# Patient Record
Sex: Female | Born: 1990 | Race: White | Hispanic: No | Marital: Single | Smoking: Current every day smoker
Health system: Southern US, Community
[De-identification: ages and names within clinical notes are randomized; demographics above are authoritative.]

## PROBLEM LIST (undated history)

## (undated) DIAGNOSIS — E282 Polycystic ovarian syndrome: Secondary | ICD-10-CM

---

## 2017-04-14 ENCOUNTER — Emergency Department (HOSPITAL_COMMUNITY)
Admission: EM | Admit: 2017-04-14 | Discharge: 2017-04-14 | Disposition: A | Discharge status: Home / Self Care | Payer: 59 | Attending: Emergency Medicine | Admitting: Emergency Medicine

## 2017-04-14 ENCOUNTER — Encounter (HOSPITAL_COMMUNITY): Payer: Self-pay | Admitting: *Deleted

## 2017-04-14 ENCOUNTER — Emergency Department (HOSPITAL_COMMUNITY): Payer: 59

## 2017-04-14 DIAGNOSIS — O26891 Other specified pregnancy related conditions, first trimester: Secondary | ICD-10-CM | POA: Diagnosis present

## 2017-04-14 DIAGNOSIS — O99331 Smoking (tobacco) complicating pregnancy, first trimester: Secondary | ICD-10-CM | POA: Insufficient documentation

## 2017-04-14 DIAGNOSIS — Z3A01 Less than 8 weeks gestation of pregnancy: Secondary | ICD-10-CM

## 2017-04-14 DIAGNOSIS — R109 Unspecified abdominal pain: Secondary | ICD-10-CM

## 2017-04-14 DIAGNOSIS — Z3A08 8 weeks gestation of pregnancy: Secondary | ICD-10-CM | POA: Insufficient documentation

## 2017-04-14 DIAGNOSIS — O0281 Inappropriate change in quantitative human chorionic gonadotropin (hCG) in early pregnancy: Secondary | ICD-10-CM | POA: Insufficient documentation

## 2017-04-14 DIAGNOSIS — F17210 Nicotine dependence, cigarettes, uncomplicated: Secondary | ICD-10-CM | POA: Insufficient documentation

## 2017-04-14 DIAGNOSIS — R110 Nausea: Secondary | ICD-10-CM

## 2017-04-14 HISTORY — DX: Polycystic ovarian syndrome: E28.2

## 2017-04-14 LAB — COMPREHENSIVE METABOLIC PANEL
ALBUMIN: 4.1 g/dL (ref 3.5–5.0)
ALT: 10 U/L — ABNORMAL LOW (ref 14–54)
AST: 13 U/L — AB (ref 15–41)
Alkaline Phosphatase: 33 U/L — ABNORMAL LOW (ref 38–126)
Anion gap: 8 (ref 5–15)
BUN: 8 mg/dL (ref 6–20)
CHLORIDE: 108 mmol/L (ref 101–111)
CO2: 21 mmol/L — AB (ref 22–32)
Calcium: 8.8 mg/dL — ABNORMAL LOW (ref 8.9–10.3)
Creatinine, Ser: 0.5 mg/dL (ref 0.44–1.00)
GFR calc Af Amer: >60 mL/min (ref >60)
GLUCOSE: 102 mg/dL — AB (ref 65–99)
POTASSIUM: 3.6 mmol/L (ref 3.5–5.1)
SODIUM: 137 mmol/L (ref 135–145)
Total Bilirubin: 0.4 mg/dL (ref 0.3–1.2)
Total Protein: 7 g/dL (ref 6.5–8.1)

## 2017-04-14 LAB — URINALYSIS, ROUTINE W REFLEX MICROSCOPIC
Bilirubin Urine: NEGATIVE
GLUCOSE, UA: NEGATIVE mg/dL
Hgb urine dipstick: NEGATIVE
KETONES UR: NEGATIVE mg/dL
LEUKOCYTES UA: NEGATIVE
Nitrite: NEGATIVE
PH: 6 (ref 5.0–8.0)
Protein, ur: NEGATIVE mg/dL
Specific Gravity, Urine: 1.024 (ref 1.005–1.030)

## 2017-04-14 LAB — CBC
HEMATOCRIT: 36.5 % (ref 36.0–46.0)
Hemoglobin: 12.3 g/dL (ref 12.0–15.0)
MCH: 30.4 pg (ref 26.0–34.0)
MCHC: 33.7 g/dL (ref 30.0–36.0)
MCV: 90.3 fL (ref 78.0–100.0)
Platelets: 314 10*3/uL (ref 150–400)
RBC: 4.04 MIL/uL (ref 3.87–5.11)
RDW: 12.1 % (ref 11.5–15.5)
WBC: 9.6 10*3/uL (ref 4.0–10.5)

## 2017-04-14 LAB — WET PREP, GENITAL
CLUE CELLS WET PREP: NONE SEEN
SPERM: NONE SEEN
TRICH WET PREP: NONE SEEN
Yeast Wet Prep HPF POC: NONE SEEN

## 2017-04-14 LAB — HCG, QUANTITATIVE, PREGNANCY: hCG, Beta Chain, Quant, S: 40418 m[IU]/mL — ABNORMAL HIGH (ref <5)

## 2017-04-14 MED ORDER — ONDANSETRON 4 MG PO TBDP
4.0000 mg | ORAL_TABLET | Freq: Once | ORAL | Status: AC
  Administered 2017-04-14: 4 mg via ORAL
  Filled 2017-04-14: qty 1

## 2017-04-14 MED ORDER — ONDANSETRON 4 MG PO TBDP: 4.0000 mg | ORAL_TABLET | Freq: Three times a day (TID) | ORAL | 0 refills | Status: AC | PRN

## 2017-04-14 NOTE — Discharge Instructions (Signed)
Take zofran for nausea Take Tylenol for pain Follow up with OBGYN

## 2017-04-14 NOTE — ED Provider Notes (Signed)
[facility]-EMERGENCY DEPT Provider Note   CSN: [id number] Arrival date & time: 04/14/17  1649     History   Chief Complaint No chief complaint on file.   HPI Bethany Walters is a 26 y.o. female who presents with left lower abdominal cramping. Past medical history significant for a PCOS. She states that she often has abdominal cramping and irregular periods however after she missed her period and cramping was worse than normal. She took a pregnancy test this morning which is positive. She is it is sharp and intermittent on the left side and sometimes dull and achy. The pain is constant but "comes in waves". She has never been pregnant before. She denies any abnormal Pap smears or prior gynecologic procedures. She reports associated vaginal spotting and nausea. No fever, chills, chest pain, shortness of breath, upper abdominal pain, vomiting, constipation or diarrhea, dysuria, vaginal discharge. LMP was 3/16.   HPI  Past Medical History:  Diagnosis Date  . PCOS (polycystic ovarian syndrome)     There are no active problems to display for this patient.   No past surgical history on file.  OB History    Gravida Para Term Preterm AB Living   1             SAB TAB Ectopic Multiple Live Births                   Home Medications    Prior to Admission medications   Not on File    Family History No family history on file.  Social History Social History  Substance Use Topics  . Smoking status: Current Every Day Smoker    Types: E-cigarettes  . Smokeless tobacco: Never Used  . Alcohol use No     Allergies   Patient has no known allergies.   Review of Systems Review of Systems  Constitutional: Negative for chills and fever.  Respiratory: Negative for shortness of breath.   Cardiovascular: Negative for chest pain.  Gastrointestinal: Positive for abdominal pain and nausea. Negative for constipation, diarrhea and vomiting.  Genitourinary: Positive for pelvic pain and vaginal  bleeding. Negative for dysuria and vaginal discharge.  Musculoskeletal: Positive for back pain.  All other systems reviewed and are negative.    Physical Exam Updated Vital Signs BP 136/79 (BP Location: Left Arm)   Pulse 79   Temp 98.1 F (36.7 C) (Oral)   Resp 18   Ht 5\' 8"  (1.727 m)   Wt 83.9 kg   LMP 02/21/2017   SpO2 100%   BMI 28.13 kg/m   Physical Exam  Constitutional: She is oriented to person, place, and time. She appears well-developed and well-nourished. No distress.  Calm, cooperative, no acute distress  HENT:  Head: Normocephalic and atraumatic.  Eyes: Conjunctivae are normal. Pupils are equal, round, and reactive to light. Right eye exhibits no discharge. Left eye exhibits no discharge. No scleral icterus.  Neck: Normal range of motion.  Cardiovascular: Normal rate and regular rhythm.  Exam reveals no gallop and no friction rub.   No murmur heard. Pulmonary/Chest: Effort normal and breath sounds normal. No respiratory distress. She has no wheezes. She has no rales. She exhibits no tenderness.  Abdominal: Soft. Bowel sounds are normal. She exhibits no distension and no mass. There is tenderness (Left lower worse than right). There is no rebound and no guarding. No hernia.  Genitourinary:  Genitourinary Comments: Pelvic: No inguinal lymphadenopathy or inguinal hernia noted. Normal external genitalia. No pain with speculum  insertion. Closed cervical os with normal appearance - no rash or lesions. No significant discharge or bleeding noted from cervix or in vaginal vault. On bimanual examination there is mild left adnexal tenderness without cervical motion tenderness. Chaperone present during exam.   Musculoskeletal:  No low back tenderness  Neurological: She is alert and oriented to person, place, and time.  Skin: Skin is warm and dry.  Psychiatric: She has a normal mood and affect. Her behavior is normal.  Nursing note and vitals reviewed.    ED Treatments /  Results  Labs (all labs ordered are listed, but only abnormal results are displayed) Labs Reviewed  WET PREP, GENITAL - Abnormal; Notable for the following:       Result Value   WBC, Wet Prep HPF POC MODERATE (*)    All other components within normal limits  COMPREHENSIVE METABOLIC PANEL - Abnormal; Notable for the following:    CO2 21 (*)    Glucose, Bld 102 (*)    Calcium 8.8 (*)    AST 13 (*)    ALT 10 (*)    Alkaline Phosphatase 33 (*)    All other components within normal limits  URINALYSIS, ROUTINE W REFLEX MICROSCOPIC - Abnormal; Notable for the following:    APPearance HAZY (*)    All other components within normal limits  HCG, QUANTITATIVE, PREGNANCY - Abnormal; Notable for the following:    hCG, Beta Chain, Quant, S 40,418 (*)    All other components within normal limits  CBC  RPR  HIV ANTIBODY (ROUTINE TESTING)  GC/CHLAMYDIA PROBE AMP ([place]) NOT AT [facility]    EKG  EKG Interpretation None       Radiology US Ob Comp < 14 Wks  Result Date: 04/14/2017 CLINICAL DATA:  26 y/o F; left-sided back pain the spotting for 2 days. EXAM: OBSTETRIC <14 WK [place] AND TRANSVAGINAL OB US TECHNIQUE: Both transabdominal and transvaginal ultrasound examinations were performed for complete evaluation of the gestation as well as the maternal uterus, adnexal regions, and pelvic cul-de-sac. Transvaginal technique was performed to assess early pregnancy. COMPARISON:  None. FINDINGS: Intrauterine gestational sac: Single Yolk sac:  Visualized. Embryo:  Visualized. Cardiac Activity: Visualized. Heart Rate: 112  bpm CRL:  6.1  mm   6 w   3 d                  [place] EDC: 12/05/2017 Subchorionic hemorrhage:  None visualized. Maternal uterus/adnexae: Left-sided corpus luteum cyst. IMPRESSION: Single live intrauterine pregnancy with estimated gestational age of [redacted] weeks and 3 days. Electronically Signed   By: Mitzi Hansen M.D.   On: 04/14/2017 22:09   US Ob Transvaginal  Result Date:  04/14/2017 CLINICAL DATA:  26 y/o F; left-sided back pain the spotting for 2 days. EXAM: OBSTETRIC <14 WK [place] AND TRANSVAGINAL OB US TECHNIQUE: Both transabdominal and transvaginal ultrasound examinations were performed for complete evaluation of the gestation as well as the maternal uterus, adnexal regions, and pelvic cul-de-sac. Transvaginal technique was performed to assess early pregnancy. COMPARISON:  None. FINDINGS: Intrauterine gestational sac: Single Yolk sac:  Visualized. Embryo:  Visualized. Cardiac Activity: Visualized. Heart Rate: 112  bpm CRL:  6.1  mm   6 w   3 d                  [place] EDC: 12/05/2017 Subchorionic hemorrhage:  None visualized. Maternal uterus/adnexae: Left-sided corpus luteum cyst. IMPRESSION: Single live intrauterine pregnancy with estimated gestational age of [redacted]  weeks and 3 days. Electronically Signed   By: Mitzi Hansen M.D.   On: 04/14/2017 22:09    Procedures Procedures (including critical care time)  Medications Ordered in ED Medications  ondansetron (ZOFRAN-ODT) disintegrating tablet 4 mg (4 mg Oral Given 04/14/17 2110)     Initial Impression / Assessment and Plan / ED Course  I have reviewed the triage vital signs and the nursing notes.  Pertinent labs & imaging results that were available during my care of the patient were reviewed by me and considered in my medical decision making (see chart for details).  26 year old female with positive pregnancy test and left-sided abdominal cramping. She was mildly hypertensive and tachycardic on arrival but on recheck vital signs are normal. CBC unremarkable. CMP overall unremarkable. UA is negative. Formal hCG is 40,000 consistent with history. Pelvic exam is unremarkable. Will obtain ultrasound to rule out ectopic.  Ultrasound remarkable for IUP at 6 weeks and 3 days. Patient reports her nausea is significantly improved after Zofran. We'll DC with prescription for Zofran and follow up with OB/GYN. Return  precautions given.  Final Clinical Impressions(s) / ED Diagnoses   Final diagnoses:  Less than [redacted] weeks gestation of pregnancy  Abdominal cramping  Nausea    New Prescriptions New Prescriptions   No medications on file     Beryle Quant 04/14/17 2329    Arby Barrette, MD 04/18/17 [id number]

## 2017-04-14 NOTE — ED Notes (Signed)
Patient transported to Ultrasound 

## 2017-04-14 NOTE — ED Triage Notes (Signed)
Pt states positive pregnancy test, lmp 02/21/17.  States severe L sided and L back cramping since last night.  States some spotting, but not large clots.

## 2017-04-15 LAB — RPR: RPR: NONREACTIVE

## 2017-04-15 LAB — HIV ANTIBODY (ROUTINE TESTING W REFLEX): HIV Screen 4th Generation wRfx: NONREACTIVE

## 2017-04-15 LAB — GC/CHLAMYDIA PROBE AMP (~~LOC~~) NOT AT ARMC
Chlamydia: NEGATIVE
NEISSERIA GONORRHEA: NEGATIVE

## 2018-08-29 ENCOUNTER — Emergency Department (HOSPITAL_COMMUNITY): Payer: 59

## 2018-08-29 ENCOUNTER — Encounter (HOSPITAL_COMMUNITY): Payer: Self-pay | Admitting: Emergency Medicine

## 2018-08-29 ENCOUNTER — Emergency Department (HOSPITAL_COMMUNITY)
Admission: EM | Admit: 2018-08-29 | Discharge: 2018-08-30 | Disposition: A | Discharge status: Home / Self Care | Payer: 59 | Attending: Emergency Medicine | Admitting: Emergency Medicine

## 2018-08-29 DIAGNOSIS — R5383 Other fatigue: Secondary | ICD-10-CM | POA: Insufficient documentation

## 2018-08-29 DIAGNOSIS — R0602 Shortness of breath: Secondary | ICD-10-CM | POA: Diagnosis not present

## 2018-08-29 DIAGNOSIS — R110 Nausea: Secondary | ICD-10-CM | POA: Insufficient documentation

## 2018-08-29 DIAGNOSIS — F17290 Nicotine dependence, other tobacco product, uncomplicated: Secondary | ICD-10-CM | POA: Diagnosis not present

## 2018-08-29 DIAGNOSIS — R42 Dizziness and giddiness: Secondary | ICD-10-CM | POA: Diagnosis not present

## 2018-08-29 LAB — BASIC METABOLIC PANEL
Anion gap: 11 (ref 5–15)
BUN: 10 mg/dL (ref 6–20)
CHLORIDE: 106 mmol/L (ref 98–111)
CO2: 23 mmol/L (ref 22–32)
CREATININE: 0.65 mg/dL (ref 0.44–1.00)
Calcium: 9.4 mg/dL (ref 8.9–10.3)
GFR calc Af Amer: >60 mL/min (ref >60)
GFR calc non Af Amer: >60 mL/min (ref >60)
Glucose, Bld: 110 mg/dL — ABNORMAL HIGH (ref 70–99)
Potassium: 4.2 mmol/L (ref 3.5–5.1)
SODIUM: 140 mmol/L (ref 135–145)

## 2018-08-29 LAB — CBC
HCT: 40 % (ref 36.0–46.0)
Hemoglobin: 13 g/dL (ref 12.0–15.0)
MCH: 30.4 pg (ref 26.0–34.0)
MCHC: 32.5 g/dL (ref 30.0–36.0)
MCV: 93.7 fL (ref 78.0–100.0)
PLATELETS: 351 10*3/uL (ref 150–400)
RBC: 4.27 MIL/uL (ref 3.87–5.11)
RDW: 11.9 % (ref 11.5–15.5)
WBC: 7.6 10*3/uL (ref 4.0–10.5)

## 2018-08-29 LAB — URINALYSIS, ROUTINE W REFLEX MICROSCOPIC
Bilirubin Urine: NEGATIVE
Glucose, UA: NEGATIVE mg/dL
HGB URINE DIPSTICK: NEGATIVE
Ketones, ur: NEGATIVE mg/dL
Leukocytes, UA: NEGATIVE
NITRITE: NEGATIVE
Protein, ur: NEGATIVE mg/dL
SPECIFIC GRAVITY, URINE: 1.008 (ref 1.005–1.030)
pH: 6 (ref 5.0–8.0)

## 2018-08-29 LAB — I-STAT BETA HCG BLOOD, ED (MC, WL, AP ONLY)

## 2018-08-29 LAB — I-STAT TROPONIN, ED: Troponin i, poc: 0 ng/mL (ref 0.00–0.08)

## 2018-08-29 MED ORDER — SODIUM CHLORIDE 0.9 % IV BOLUS
1000.0000 mL | Freq: Once | INTRAVENOUS | Status: AC
  Administered 2018-08-29: 1000 mL via INTRAVENOUS

## 2018-08-29 NOTE — ED Notes (Signed)
Will come back to room, when radiology returns pt

## 2018-08-29 NOTE — ED Triage Notes (Signed)
Pt presents with feelings of dizziness, lightheadedness, and nausea and also anxiety and panic attacks; pt was seen at [facility] and was told she has a vitamin B12 deficiency and told to eat more B12 containing foods; pt denies pain currently; pt states she has been under a lot more stress lately and changed her diet

## 2018-08-29 NOTE — ED Notes (Signed)
Patient transported to X-ray 

## 2018-08-30 NOTE — Discharge Instructions (Addendum)
1. Medications: usual home medications 2. Treatment: rest, drink plenty of fluids, Take Vitamin B12 orally 1x per day.  Additionally, please take calcium 500mg  per day and Vitamin D3 800 units daily - discuss these doses with your PCP   3. Follow Up: Please followup with your primary doctor at your upcoming appointment for discussion of your diagnoses and further evaluation after today's visit; please use the resource guide to find a psychiatrist; Please return to the ER for new or worsening symptoms including total syncope or other concerns.

## 2018-08-30 NOTE — ED Provider Notes (Signed)
[facility]   CSN: [id number] Arrival date & time: 08/29/18  2212     History   Chief Complaint Chief Complaint  Patient presents with  . Dizziness  . Near Syncope  . Nausea  . Abdominal Pain  . Tingling  . Panic Attack    HPI Bethany Walters is a 27 y.o. female with a hx of PCOS presents to the Emergency Department complaining of gradual, intermittent, progressively worsening fatigue and lightheadedness onset approx 6 mos ago but worsening in the last week.  Pt reports in the last 2 days she has had several episodes of panicking when she began to feel dizzy.  She reports these led to a feeling of SOB and worsening dizziness.  She reports symptoms of SOB and dizziness abate when her panicking subsides.  Pt reports no formal diagnosis of anxiety or panic attacks, but she does believe this is what they are.  She reports she has been vegetarian for several years ago and switched to a vegan diet about the time these symptoms started.  She reports she has had associated nausea today after eating chicken for the first time in many years.  No vomiting or diarrhea. Pt reports she is scheduled with a new PCP in 8 days, but wanted to get checked out tonight after her dizzy episode.  She reports increased stress in her life in the last few months.  She denies SI/HI/AVH.    The history is provided by the patient and medical records. No language interpreter was used.    Past Medical History:  Diagnosis Date  . PCOS (polycystic ovarian syndrome)     There are no active problems to display for this patient.   History reviewed. No pertinent surgical history.   OB History    Gravida  1   Para      Term      Preterm      AB      Living        SAB      TAB      Ectopic      Multiple      Live Births               Home Medications    Prior to Admission medications   Medication Sig Start Date End Date Taking?  Authorizing Provider  ondansetron (ZOFRAN ODT) 4 MG disintegrating tablet Take 1 tablet (4 mg total) by mouth every 8 (eight) hours as needed for nausea or vomiting. Patient not taking: Reported on 08/29/2018 04/14/17   Bethel BornGekas, Kelly Marie, PA-C    Family History History reviewed. No pertinent family history.  Social History Social History   Tobacco Use  . Smoking status: Current Every Day Smoker    Types: E-cigarettes  . Smokeless tobacco: Never Used  . Tobacco comment: vape  Substance Use Topics  . Alcohol use: No  . Drug use: No     Allergies   Patient has no known allergies.   Review of Systems Review of Systems  Constitutional: Negative for appetite change, diaphoresis, fatigue, fever and unexpected weight change.  HENT: Negative for mouth sores.   Eyes: Negative for visual disturbance.  Respiratory: Negative for cough, chest tightness, shortness of breath and wheezing.   Cardiovascular: Positive for near-syncope. Negative for chest pain.  Gastrointestinal: Positive for nausea. Negative for abdominal pain, constipation, diarrhea and vomiting.  Endocrine: Negative for polydipsia, polyphagia and polyuria.  Genitourinary: Negative for  dysuria, frequency, hematuria and urgency.  Musculoskeletal: Negative for back pain and neck stiffness.  Skin: Negative for rash.  Allergic/Immunologic: Negative for immunocompromised state.  Neurological: Positive for dizziness and light-headedness. Negative for syncope and headaches.  Hematological: Does not bruise/bleed easily.  Psychiatric/Behavioral: Negative for sleep disturbance. The patient is not nervous/anxious.      Physical Exam Updated Vital Signs BP (!) 139/103   Pulse 66   Temp 98 F (36.7 C) (Oral)   Resp 20   Ht 5\' 8"  (1.727 m)   Wt 100.7 kg   LMP 08/13/2018   SpO2 99%   Breastfeeding? No   BMI 33.75 kg/m   Physical Exam  Constitutional: She appears well-developed and well-nourished. No distress.  Awake,  alert, nontoxic appearance  HENT:  Head: Normocephalic and atraumatic.  Mouth/Throat: Oropharynx is clear and moist. No oropharyngeal exudate.  Eyes: Conjunctivae are normal. No scleral icterus.  Neck: Normal range of motion. Neck supple.  Cardiovascular: Normal rate, regular rhythm and intact distal pulses.  Pulmonary/Chest: Effort normal and breath sounds normal. No respiratory distress. She has no wheezes.  Equal chest expansion  Abdominal: Soft. Bowel sounds are normal. She exhibits no mass. There is no tenderness. There is no rebound and no guarding.  Musculoskeletal: Normal range of motion. She exhibits no edema.  Neurological: She is alert.  Speech is clear and goal oriented Moves extremities without ataxia  Skin: Skin is warm and dry. She is not diaphoretic.  Psychiatric: Her speech is normal and behavior is normal. Judgment and thought content normal. Her mood appears anxious. She is not actively hallucinating. Cognition and memory are normal. She expresses no homicidal and no suicidal ideation.  Nursing note and vitals reviewed.    ED Treatments / Results  Labs (all labs ordered are listed, but only abnormal results are displayed) Labs Reviewed  BASIC METABOLIC PANEL - Abnormal; Notable for the following components:      Result Value   Glucose, Bld 110 (*)    All other components within normal limits  URINALYSIS, ROUTINE W REFLEX MICROSCOPIC - Abnormal; Notable for the following components:   Color, Urine STRAW (*)    All other components within normal limits  CBC  I-STAT TROPONIN, ED  I-STAT BETA HCG BLOOD, ED ([facility], [facility], [facility] ONLY)    EKG None  Radiology Dg Chest 2 View  Result Date: 08/29/2018 CLINICAL DATA:  Dizziness and lightheadedness with nausea and anxiety. EXAM: CHEST - 2 VIEW COMPARISON:  None. FINDINGS: The heart size and mediastinal contours are within normal limits. Both lungs are clear. S-shaped scoliosis of the thoracolumbar spine. No acute osseous  abnormality. IMPRESSION: No active cardiopulmonary disease. Electronically Signed   By: Tollie Eth M.D.   On: 08/29/2018 23:12    Procedures Procedures (including critical care time)  Medications Ordered in ED Medications  sodium chloride 0.9 % bolus 1,000 mL (1,000 mLs Intravenous New Bag/Given 08/29/18 2350)     Initial Impression / Assessment and Plan / ED Course  I have reviewed the triage vital signs and the nursing notes.  Pertinent labs & imaging results that were available during my care of the patient were reviewed by me and considered in my medical decision making (see chart for details).  Clinical Course as of Aug 30 126  Wynelle Link Aug 30, 2018  0118 Mild hypertension noted.  Suspect this is secondary to anxiety  BP(!): 139/103 [HM]  0118 No evidence of urinary tract infection  Protein: NEGATIVE [HM]  0119  No anemia  Hemoglobin: 13.0 [HM]  0119 Negative troponin  Troponin i, poc: 0.00 [HM]  0119 Patient is not pregnant  I-stat hCG, quantitative: <5.0 [HM]    Clinical Course User Index [HM] Yajayra Feldt, Dahlia Client, PA-C    Presents with history of intermittent lightheadedness and anxiousness.  No symptoms upon assessment tonight here in the emergency department.  Vital signs are stable without tachycardia.  No full episodes of syncope.  Patient is hypertensive however she is also very anxious in the room.  Labs are reassuring.  Chest x-ray is without evidence of pneumonia, pneumothorax or pulmonary edema.  I personally evaluated these images.  Troponin is negative and EKG is without acute ischemia.  Highly doubt ACS, pulmonary embolism.  No tachycardia.  PERC negative.  Patient is low risk.  She is not orthostatic.  Orthostatic VS for the past 24 hrs:  BP- Lying Pulse- Lying BP- Sitting Pulse- Sitting BP- Standing at 0 minutes Pulse- Standing at 0 minutes  08/29/18 2355 (!) 127/91 63 (!) 143/111 74 (!) 149/120 78   Patient given fluids.  She reports she does feel better.   Long discussion with patient about the importance of proper nutrition.  She is taking a multivitamin however this is new.  Also discussed the need for evaluation by primary care provider and psychiatry for further evaluation and treatment.  Patient reports she will follow-up as directed.  Discussed reasons to return immediately to the emergency department including full episodes of syncope, return of symptoms, fevers, chills or other concerns.  Final Clinical Impressions(s) / ED Diagnoses   Final diagnoses:  Lightheadedness    ED Discharge Orders    None       Mardene Sayer Boyd Kerbs 08/30/18 0128    Gilda Crease, MD 08/30/18 [phone]

## 2019-11-27 IMAGING — CR DG CHEST 2V
2 series · 2 of 2 positions shown · non-contrast
Comparison: None.

CLINICAL DATA: Dizziness and lightheadedness with nausea and
anxiety.

EXAM:
CHEST - 2 VIEW

[chest pa]
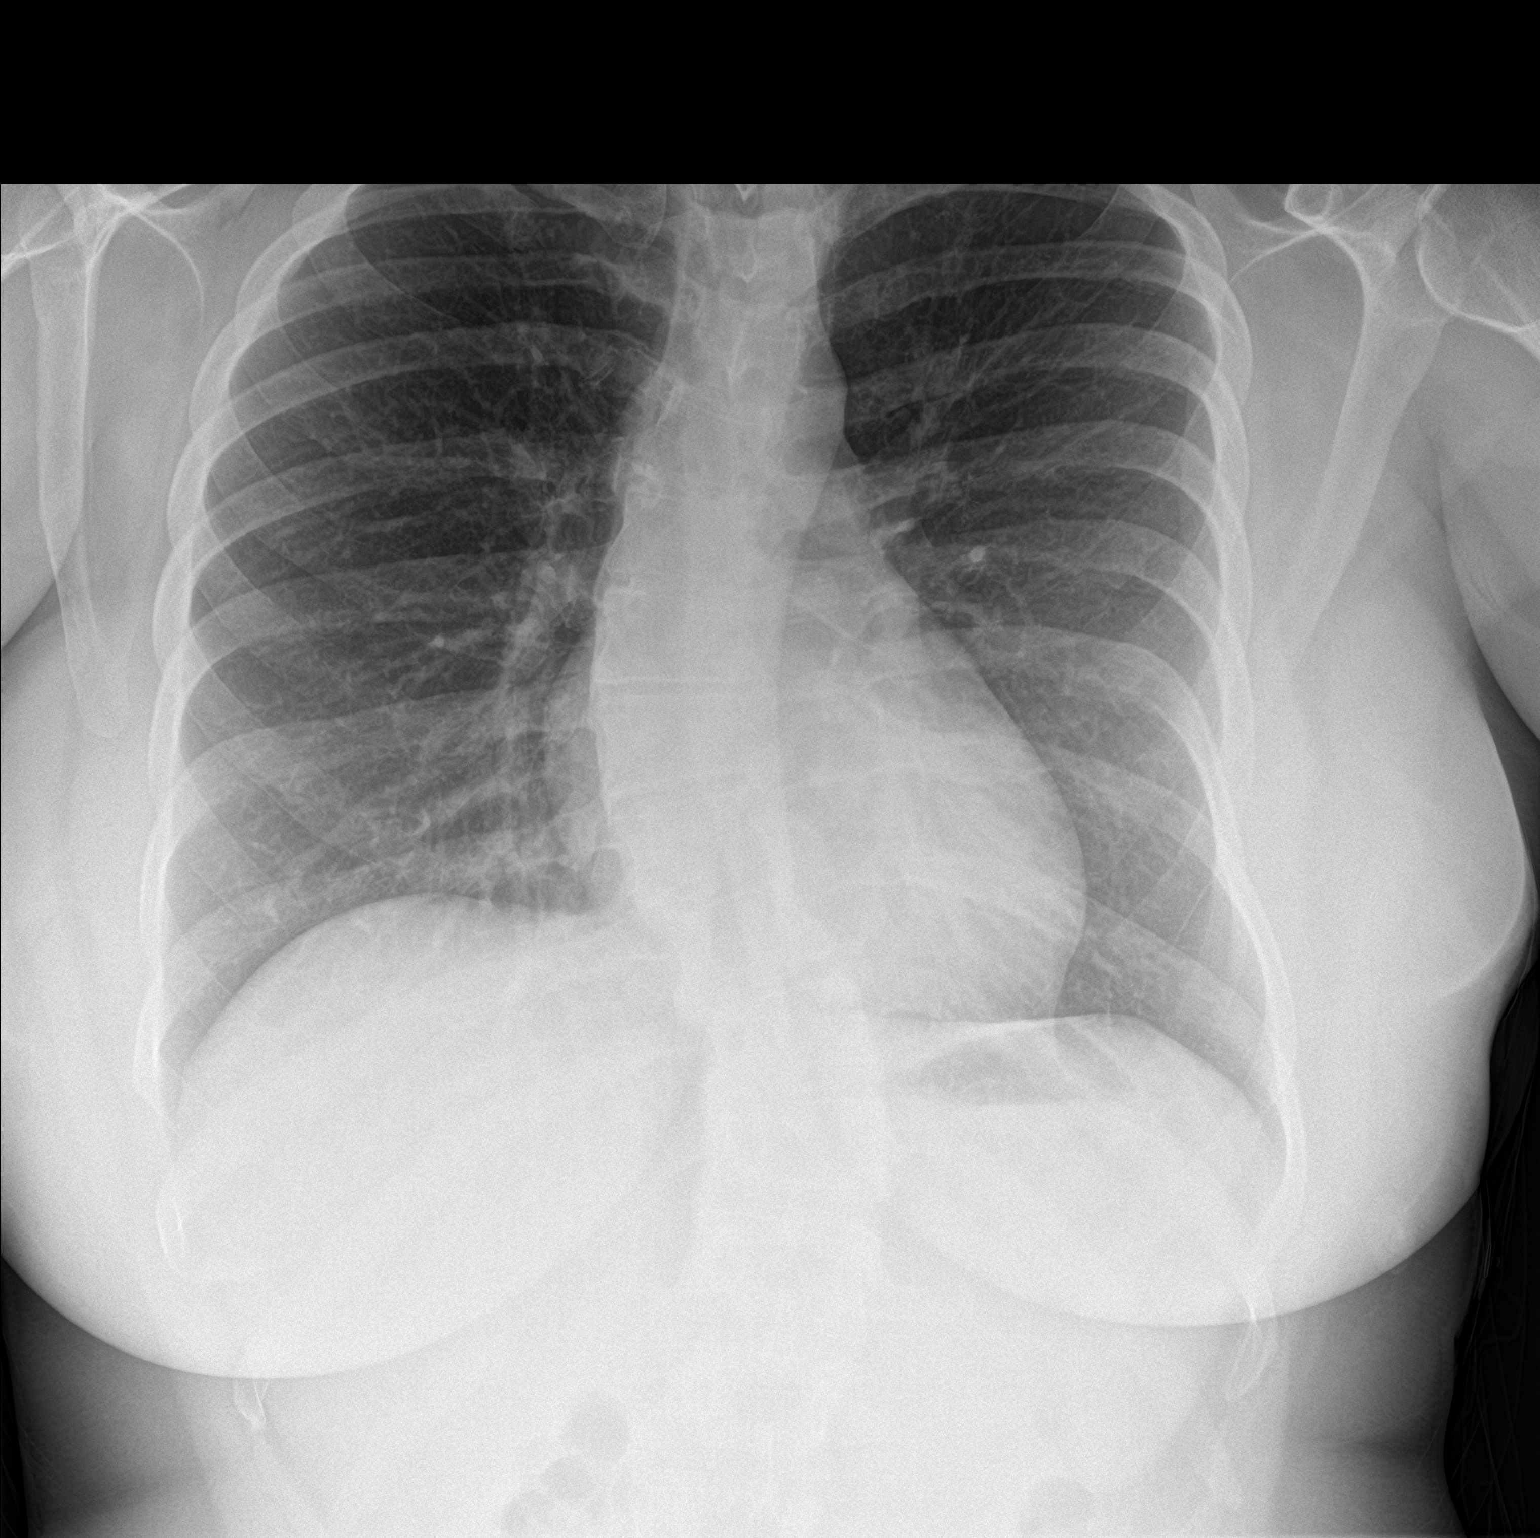

[chest lat]
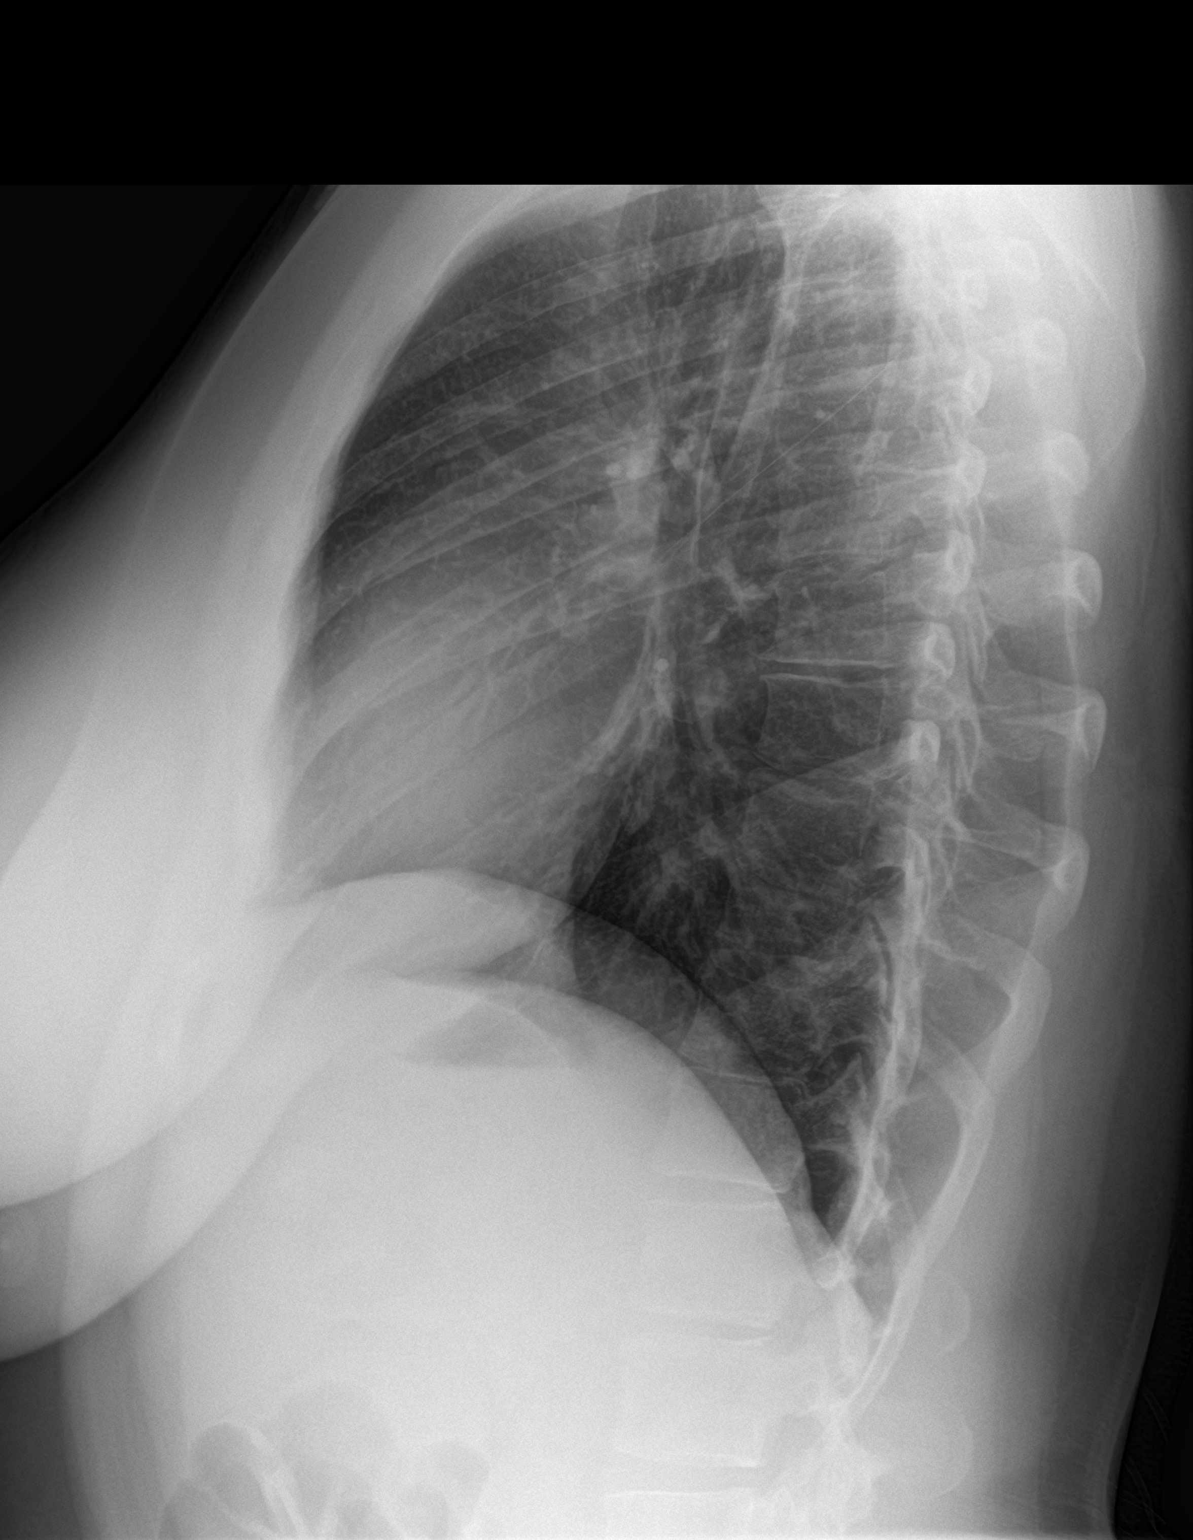

[2 of 2 positions shown; findings below may reference images not displayed]

FINDINGS: The heart size and mediastinal contours are within normal limits.
Both lungs are clear. S-shaped scoliosis of the thoracolumbar spine.
No acute osseous abnormality.
IMPRESSION: No active cardiopulmonary disease.
